# Patient Record
Sex: Male | Born: 2005 | Hispanic: Yes | Marital: Single | Smoking: Never smoker
Health system: Western US, Academic
[De-identification: ages and names within clinical notes are randomized; demographics above are authoritative.]

## PROBLEM LIST (undated history)

## (undated) DIAGNOSIS — H9190 Unspecified hearing loss, unspecified ear: Secondary | ICD-10-CM

---
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
## 2023-09-12 ENCOUNTER — Ambulatory Visit: Admitting: Family

## 2023-10-07 ENCOUNTER — Encounter: Payer: Self-pay | Admitting: Physician Assistant

## 2023-10-07 ENCOUNTER — Ambulatory Visit (INDEPENDENT_AMBULATORY_CARE_PROVIDER_SITE_OTHER): Admitting: Physician Assistant

## 2023-10-07 VITALS — BP 110/71 | HR 72 | Temp 98.0°F | Resp 18 | Ht 71.0 in | Wt 164.2 lb

## 2023-10-07 DIAGNOSIS — R110 Nausea: Secondary | ICD-10-CM

## 2023-10-07 DIAGNOSIS — K589 Irritable bowel syndrome without diarrhea: Secondary | ICD-10-CM | POA: Diagnosis not present

## 2023-10-07 MED ORDER — ONDANSETRON HCL 4 MG PO TABS: 4.0000 mg | ORAL_TABLET | Freq: Three times a day (TID) | ORAL | 0 refills | Status: DC | PRN

## 2023-10-07 NOTE — Assessment & Plan Note (Signed)
 Sxs of nausea, intermittent pain , diarrhea and constipation Pt was managed on bentyl prn and amitriptyline, but it was ineffective  Recommending zofran  prn nausea and referral to GI

## 2023-10-07 NOTE — Progress Notes (Signed)
 New patient visit   Patient: Terry Mcknight   DOB: [birth year]   18 y.o. Male  MRN: [id number] Visit Date: 10/07/2023  Today's healthcare provider: Trenton Frock, PA-C   Cc. New patient   Subjective    Terry Mcknight is a 18 y.o. male who presents today as a new patient to establish care.  Discussed the use of AI scribe software for clinical note transcription with the patient, who gave verbal consent to proceed.  History of Present Illness   The patient, with a known history of irritable bowel syndrome (IBS), presents with a lump on the left side of his neck that has been slowly decreasing in size over the past two to three weeks. The patient reports that the lump is no longer palpable.   The patient also reports worsening IBS symptoms, including increased nausea and intermittent pain. The patient describes the pain as brief, lasting two to three minutes before dissipating. The patient has been experiencing both diarrhea and constipation, with a tendency towards diarrhea. The patient notes that stress appears to exacerbate his IBS symptoms. The patient had previously been prescribed medication for IBS, including a medication for bentyl and amitriptyline, but stopped taking them due to perceived lack of efficacy. The patient is seeking a higher dosage of his IBS medication to better manage his symptoms.       Past Medical History:  Diagnosis Date   Hearing loss    has hearing aids, but doesn't wear them   History reviewed. No pertinent surgical history. Family Status  Relation Name Status   Father  (Not Specified)  No partnership data on file   Family History  Problem Relation Age of Onset   Hypertension Father    Social History   Socioeconomic History   Marital status: Single    Spouse name: Not on file   Number of children: Not on file   Years of education: Not on file   Highest education level: Not on file  Occupational History   Not on file   Tobacco Use   Smoking status: Never   Smokeless tobacco: Never  Vaping Use   Vaping status: Never Used  Substance and Sexual Activity   Alcohol use: No   Drug use: No   Sexual activity: Not Currently    Comment: pt not forthcoming   Other Topics Concern   Not on file  Social History Narrative   Not on file   Social Drivers of Health   Financial Resource Strain: Not on file  Food Insecurity: Not on file  Transportation Needs: Not on file  Physical Activity: Not on file  Stress: Not on file  Social Connections: Not on file   Outpatient Medications Prior to Visit  Medication Sig   [DISCONTINUED] escitalopram  (LEXAPRO ) 10 MG tablet Take 1 tablet (10 mg total) by mouth every morning.   No facility-administered medications prior to visit.   No Known Allergies   There is no immunization history on file for this patient.  Health Maintenance  Topic Date Due   COVID-19 Vaccine (1) Never done   HPV VACCINES (1 - Risk male 3-dose series) Never done   HIV Screening  Never done   Meningococcal B Vaccine (1 of 2 - Standard) Never done   Hepatitis C Screening  Never done   DTaP/Tdap/Td (1 - Tdap) Never done   INFLUENZA VACCINE  01/16/2024    Patient Care Team: Trenton Frock, PA-C as PCP - General (Physician Assistant)  Review of Systems  Constitutional:  Negative for fatigue and fever.  Respiratory:  Negative for cough and shortness of breath.   Cardiovascular:  Negative for chest pain, palpitations and leg swelling.  Gastrointestinal:  Positive for abdominal pain, constipation, diarrhea and nausea.  Neurological:  Negative for dizziness and headaches.        Objective    BP 110/71 (BP Location: Left Arm, Patient Position: Sitting, Cuff Size: Normal)   Pulse 72   Temp 98 F (36.7 C) (Oral)   Resp 18   Ht 5\' 11"  (1.803 m)   Wt 164 lb 3.2 oz (74.5 kg)   SpO2 98%   BMI 22.90 kg/m     Physical Exam Constitutional:      General: He is awake.      Appearance: He is well-developed.  HENT:     Head: Normocephalic.  Eyes:     Conjunctiva/sclera: Conjunctivae normal.  Cardiovascular:     Rate and Rhythm: Normal rate and regular rhythm.     Heart sounds: Normal heart sounds.  Pulmonary:     Effort: Pulmonary effort is normal.     Breath sounds: Normal breath sounds.  Abdominal:     General: There is no distension.     Palpations: Abdomen is soft.     Tenderness: There is no abdominal tenderness. There is no guarding.  Lymphadenopathy:     Cervical: No cervical adenopathy.  Skin:    General: Skin is warm.  Neurological:     Mental Status: He is alert and oriented to person, place, and time.  Psychiatric:        Attention and Perception: Attention normal.        Mood and Affect: Mood normal.        Speech: Speech normal.        Behavior: Behavior is cooperative.    Depression Screen     No data to display         No results found for any visits on 10/07/23.  Assessment & Plan     Irritable bowel syndrome, unspecified type Assessment & Plan: Sxs of nausea, intermittent pain , diarrhea and constipation Pt was managed on bentyl prn and amitriptyline, but it was ineffective  Recommending zofran  prn nausea and referral to GI  Orders: -     Ondansetron  HCl; Take 1 tablet (4 mg total) by mouth every 8 (eight) hours as needed for nausea or vomiting.  Dispense: 20 tablet; Refill: 0 -     Ambulatory referral to Gastroenterology  Nausea -     Ondansetron  HCl; Take 1 tablet (4 mg total) by mouth every 8 (eight) hours as needed for nausea or vomiting.  Dispense: 20 tablet; Refill: 0 -     Ambulatory referral to Gastroenterology   No appreciated cervical lymphadenopathy or mass. Encouraged pt to monitor for recurrence   Return in about 6 months (around 04/07/2024) for CPE.      Trenton Frock, PA-C  [facility] [phone] (phone) [phone] (fax)  [facility]

## 2023-10-23 ENCOUNTER — Other Ambulatory Visit: Payer: Self-pay | Admitting: Physician Assistant

## 2023-10-23 DIAGNOSIS — K589 Irritable bowel syndrome without diarrhea: Secondary | ICD-10-CM

## 2023-10-23 DIAGNOSIS — R110 Nausea: Secondary | ICD-10-CM

## 2023-11-18 ENCOUNTER — Encounter: Payer: Self-pay | Admitting: Physician Assistant

## 2023-12-08 ENCOUNTER — Other Ambulatory Visit: Payer: Self-pay | Admitting: Physician Assistant

## 2023-12-08 DIAGNOSIS — K589 Irritable bowel syndrome without diarrhea: Secondary | ICD-10-CM

## 2023-12-08 DIAGNOSIS — R110 Nausea: Secondary | ICD-10-CM

## 2024-02-22 ENCOUNTER — Emergency Department (HOSPITAL_BASED_OUTPATIENT_CLINIC_OR_DEPARTMENT_OTHER)
Admission: EM | Admit: 2024-02-22 | Discharge: 2024-02-22 | Disposition: A | Discharge status: Home / Self Care | Source: Ambulatory Visit | Attending: Emergency Medicine | Admitting: Emergency Medicine

## 2024-02-22 ENCOUNTER — Encounter (HOSPITAL_BASED_OUTPATIENT_CLINIC_OR_DEPARTMENT_OTHER): Payer: Self-pay

## 2024-02-22 DIAGNOSIS — W272XXA Contact with scissors, initial encounter: Secondary | ICD-10-CM | POA: Insufficient documentation

## 2024-02-22 DIAGNOSIS — S61210A Laceration without foreign body of right index finger without damage to nail, initial encounter: Secondary | ICD-10-CM | POA: Insufficient documentation

## 2024-02-22 DIAGNOSIS — Y990 Civilian activity done for income or pay: Secondary | ICD-10-CM | POA: Insufficient documentation

## 2024-02-22 NOTE — ED Provider Notes (Signed)
 [place] [facility] AT [facility] Provider Note   CSN: [id number] Arrival date & time: 02/22/24  1248     Patient presents with: Laceration   Terry Mcknight is a 18 y.o. male incidentally cut his right index finger with a pair of scissors while at work yesterday.  Controlled bleeding with direct pressure, came in today as he was concerned this may need laceration repair.  States he had his last tetanus vaccination within the last 2 years, does not have any other pertinent medical history.    Laceration      Prior to Admission medications   Medication Sig Start Date End Date Taking? Authorizing Provider  ondansetron  (ZOFRAN ) 4 MG tablet TAKE 1 TABLET BY MOUTH EVERY 8 HOURS AS NEEDED FOR NAUSEA AND VOMITING 12/08/23   Cyndi Shaver, PA-C    Allergies: Patient has no known allergies.    Review of Systems  Skin:  Positive for wound.  All other systems reviewed and are negative.   Updated Vital Signs BP 132/84 (BP Location: Left Arm)   Pulse 77   Temp 98.9 F (37.2 C) (Oral)   Resp 17   Wt 72.6 kg   SpO2 99%   BMI 22.32 kg/m   Physical Exam Vitals and nursing note reviewed.  Constitutional:      General: He is not in acute distress.    Appearance: He is well-developed.  HENT:     Head: Normocephalic and atraumatic.  Eyes:     Conjunctiva/sclera: Conjunctivae normal.  Cardiovascular:     Rate and Rhythm: Normal rate and regular rhythm.     Heart sounds: No murmur heard. Pulmonary:     Effort: Pulmonary effort is normal. No respiratory distress.     Breath sounds: Normal breath sounds.  Abdominal:     Palpations: Abdomen is soft.     Tenderness: There is no abdominal tenderness.  Musculoskeletal:        General: No swelling.     Cervical back: Neck supple.  Skin:    General: Skin is warm and dry.     Capillary Refill: Capillary refill takes less than 2 seconds.     Findings: Wound present.     Comments: Small avulsion to the  distal volar tip of the index finger of the right hand.  Bleeding largely controlled prior to his arrival.  Neurological:     Mental Status: He is alert.  Psychiatric:        Mood and Affect: Mood normal.     (all labs ordered are listed, but only abnormal results are displayed) Labs Reviewed - No data to display  EKG: None  Radiology: No results found.   Procedures   Medications Ordered in the ED - No data to display                                  Medical Decision Making  Given the appearance of the wound, there is no repairable laceration, skin was avulsed from the distal tip of the finger and there is an open superficial wound currently.  There is no exposed bone or deep tissue, as such we will manage with bacitracin application and bulky dressing.  He understands and agrees, has no further concerns at this time.  Findings stable for discharge and outpatient follow-up.     Final diagnoses:  Laceration of right index finger without foreign body without damage  to nail, initial encounter    ED Discharge Orders     None          Myriam Dorn BROCKS, [place] 02/22/24 1329    Charlyn Sora, MD 02/22/24 1344

## 2024-02-22 NOTE — ED Triage Notes (Signed)
 Cut left ring finger yesterday cutting open box with scissors. Currently bandaged . Bleeding controlled

## 2024-04-02 ENCOUNTER — Encounter: Payer: Self-pay | Admitting: Family Medicine

## 2024-04-02 DIAGNOSIS — Z0000 Encounter for general adult medical examination without abnormal findings: Secondary | ICD-10-CM

## 2024-04-05 ENCOUNTER — Other Ambulatory Visit: Payer: Self-pay | Admitting: Family Medicine

## 2024-04-05 DIAGNOSIS — R110 Nausea: Secondary | ICD-10-CM

## 2024-04-05 DIAGNOSIS — K589 Irritable bowel syndrome without diarrhea: Secondary | ICD-10-CM

## 2024-04-05 MED ORDER — ONDANSETRON HCL 4 MG PO TABS: 4.0000 mg | ORAL_TABLET | Freq: Three times a day (TID) | ORAL | 2 refills | Status: AC | PRN

## 2024-04-05 NOTE — Telephone Encounter (Signed)
 Copied from CRM [id number]. Topic: Clinical - Medication Refill >> Apr 05, 2024  8:05 AM Mercedes MATSU wrote: Medication: ondansetron  (ZOFRAN ) 4 MG tablet   Has the patient contacted their pharmacy? Yes (Agent: If no, request that the patient contact the pharmacy for the refill. If patient does not wish to contact the pharmacy document the reason why and proceed with request.) (Agent: If yes, when and what did the pharmacy advise?)  This is the patient's preferred pharmacy:  [id number] [address], [place] - [address] [place] [address] Phone: [phone] Fax: [phone]   Is this the correct pharmacy for this prescription? Yes If no, delete pharmacy and type the correct one.   Has the prescription been filled recently? Yes  Is the patient out of the medication? Yes  Has the patient been seen for an appointment in the last year OR does the patient have an upcoming appointment? No  Can we respond through MyChart? Yes  Agent: Please be advised that Rx refills may take up to 3 business days. We ask that you follow-up with your pharmacy.

## 2024-04-05 NOTE — Telephone Encounter (Signed)
 Pas states I attempted to schedule TOC with [facility] for Jan. And patient refused. He was a former patient of Manuelita Flatness and is requesting a refill. He is aware it may not be refilled due to [facility]addell never seeing him before, but he insisted I try.
